# Patient Record
Sex: Female | Born: 1940 | Race: White | Hispanic: No | Smoking: Never smoker
Health system: Southern US, Community
[De-identification: ages and names within clinical notes are randomized; demographics above are authoritative.]

## PROBLEM LIST (undated history)

## (undated) DIAGNOSIS — E569 Vitamin deficiency, unspecified: Secondary | ICD-10-CM

## (undated) DIAGNOSIS — R42 Dizziness and giddiness: Secondary | ICD-10-CM

## (undated) DIAGNOSIS — N390 Urinary tract infection, site not specified: Secondary | ICD-10-CM

## (undated) DIAGNOSIS — G2581 Restless legs syndrome: Secondary | ICD-10-CM

## (undated) HISTORY — DX: Urinary tract infection, site not specified: N39.0

## (undated) HISTORY — DX: Vitamin deficiency, unspecified: E56.9

## (undated) HISTORY — DX: Restless legs syndrome: G25.81

## (undated) HISTORY — DX: Dizziness and giddiness: R42

---

## 2008-02-07 ENCOUNTER — Emergency Department: Payer: Self-pay | Admitting: Emergency Medicine

## 2013-04-26 ENCOUNTER — Emergency Department: Payer: Self-pay | Admitting: Emergency Medicine

## 2013-04-26 LAB — COMPREHENSIVE METABOLIC PANEL
Albumin: 3.9 g/dL (ref 3.4–5.0)
Anion Gap: 5 — ABNORMAL LOW (ref 7–16)
BUN: 9 mg/dL (ref 7–18)
Calcium, Total: 9.1 mg/dL (ref 8.5–10.1)
Co2: 30 mmol/L (ref 21–32)
Creatinine: 0.72 mg/dL (ref 0.60–1.30)
EGFR (Non-African Amer.): >60
Glucose: 86 mg/dL (ref 65–99)
Potassium: 3.5 mmol/L (ref 3.5–5.1)
SGOT(AST): 22 U/L (ref 15–37)
SGPT (ALT): 23 U/L (ref 12–78)
Sodium: 138 mmol/L (ref 136–145)
Total Protein: 8.1 g/dL (ref 6.4–8.2)

## 2013-04-26 LAB — CBC WITH DIFFERENTIAL/PLATELET
Basophil #: 0.1 10*3/uL (ref 0.0–0.1)
Basophil %: 1.1 %
Eosinophil #: 0.4 10*3/uL (ref 0.0–0.7)
HCT: 31.2 % — ABNORMAL LOW (ref 35.0–47.0)
HGB: 10 g/dL — ABNORMAL LOW (ref 12.0–16.0)
Lymphocyte #: 2 10*3/uL (ref 1.0–3.6)
Lymphocyte %: 24.8 %
MCH: 24.7 pg — ABNORMAL LOW (ref 26.0–34.0)
MCHC: 31.9 g/dL — ABNORMAL LOW (ref 32.0–36.0)
Monocyte %: 6.1 %
Neutrophil #: 5 10*3/uL (ref 1.4–6.5)
Platelet: 350 10*3/uL (ref 150–440)
RBC: 4.04 10*6/uL (ref 3.80–5.20)
RDW: 16 % — ABNORMAL HIGH (ref 11.5–14.5)
WBC: 8 10*3/uL (ref 3.6–11.0)

## 2013-04-30 ENCOUNTER — Inpatient Hospital Stay: Payer: Self-pay | Admitting: Specialist

## 2013-04-30 LAB — COMPREHENSIVE METABOLIC PANEL
Albumin: 3.7 g/dL (ref 3.4–5.0)
Alkaline Phosphatase: 131 U/L (ref 50–136)
BUN: 11 mg/dL (ref 7–18)
Bilirubin,Total: 0.7 mg/dL (ref 0.2–1.0)
Calcium, Total: 9.3 mg/dL (ref 8.5–10.1)
Chloride: 103 mmol/L (ref 98–107)
EGFR (Non-African Amer.): >60
Glucose: 102 mg/dL — ABNORMAL HIGH (ref 65–99)
SGOT(AST): 60 U/L — ABNORMAL HIGH (ref 15–37)
Sodium: 138 mmol/L (ref 136–145)
Total Protein: 8 g/dL (ref 6.4–8.2)

## 2013-04-30 LAB — URINALYSIS, COMPLETE
Blood: NEGATIVE
Glucose,UR: NEGATIVE mg/dL (ref 0–75)
Leukocyte Esterase: NEGATIVE
Protein: NEGATIVE
Specific Gravity: 1.015 (ref 1.003–1.030)
Squamous Epithelial: 1
WBC UR: 2 /HPF (ref 0–5)

## 2013-04-30 LAB — CBC
HGB: 9.8 g/dL — ABNORMAL LOW (ref 12.0–16.0)
MCH: 24.4 pg — ABNORMAL LOW (ref 26.0–34.0)
MCHC: 32 g/dL (ref 32.0–36.0)
MCV: 76 fL — ABNORMAL LOW (ref 80–100)
Platelet: 353 10*3/uL (ref 150–440)
RDW: 15.8 % — ABNORMAL HIGH (ref 11.5–14.5)
WBC: 13.5 10*3/uL — ABNORMAL HIGH (ref 3.6–11.0)

## 2013-04-30 LAB — TROPONIN I: Troponin-I: <0.02 ng/mL

## 2013-05-01 ENCOUNTER — Ambulatory Visit: Payer: Self-pay | Admitting: Urology

## 2013-05-01 LAB — BASIC METABOLIC PANEL
Anion Gap: 6 — ABNORMAL LOW (ref 7–16)
Calcium, Total: 8.2 mg/dL — ABNORMAL LOW (ref 8.5–10.1)
Chloride: 100 mmol/L (ref 98–107)
Creatinine: 0.63 mg/dL (ref 0.60–1.30)
EGFR (African American): >60
EGFR (Non-African Amer.): >60
Glucose: 101 mg/dL — ABNORMAL HIGH (ref 65–99)
Osmolality: 270 (ref 275–301)
Potassium: 2.9 mmol/L — ABNORMAL LOW (ref 3.5–5.1)
Sodium: 136 mmol/L (ref 136–145)

## 2013-05-01 LAB — CBC WITH DIFFERENTIAL/PLATELET
Basophil #: 0 10*3/uL (ref 0.0–0.1)
Basophil %: 0.2 %
Eosinophil #: 0 10*3/uL (ref 0.0–0.7)
HCT: 27.4 % — ABNORMAL LOW (ref 35.0–47.0)
HGB: 8.9 g/dL — ABNORMAL LOW (ref 12.0–16.0)
Lymphocyte #: 0.7 10*3/uL — ABNORMAL LOW (ref 1.0–3.6)
Lymphocyte %: 5.4 %
Monocyte %: 6.4 %
Neutrophil #: 11.6 10*3/uL — ABNORMAL HIGH (ref 1.4–6.5)
Neutrophil %: 87.9 %
RBC: 3.61 10*6/uL — ABNORMAL LOW (ref 3.80–5.20)
WBC: 13.2 10*3/uL — ABNORMAL HIGH (ref 3.6–11.0)

## 2013-05-02 LAB — BASIC METABOLIC PANEL
BUN: 8 mg/dL (ref 7–18)
Chloride: 99 mmol/L (ref 98–107)
Co2: 32 mmol/L (ref 21–32)
Creatinine: 0.51 mg/dL — ABNORMAL LOW (ref 0.60–1.30)
Glucose: 105 mg/dL — ABNORMAL HIGH (ref 65–99)
Osmolality: 269 (ref 275–301)
Sodium: 135 mmol/L — ABNORMAL LOW (ref 136–145)

## 2013-05-03 LAB — CBC WITH DIFFERENTIAL/PLATELET
Basophil #: 0.1 10*3/uL (ref 0.0–0.1)
Eosinophil #: 0.1 10*3/uL (ref 0.0–0.7)
Eosinophil %: 1.1 %
HCT: 23.9 % — ABNORMAL LOW (ref 35.0–47.0)
HGB: 7.9 g/dL — ABNORMAL LOW (ref 12.0–16.0)
Lymphocyte %: 15.8 %
MCH: 25.3 pg — ABNORMAL LOW (ref 26.0–34.0)
MCHC: 33.2 g/dL (ref 32.0–36.0)
MCV: 76 fL — ABNORMAL LOW (ref 80–100)
Platelet: 310 10*3/uL (ref 150–440)
RBC: 3.14 10*6/uL — ABNORMAL LOW (ref 3.80–5.20)
RDW: 16.1 % — ABNORMAL HIGH (ref 11.5–14.5)
WBC: 10.1 10*3/uL (ref 3.6–11.0)

## 2013-05-03 LAB — BASIC METABOLIC PANEL
BUN: 8 mg/dL (ref 7–18)
Calcium, Total: 8.1 mg/dL — ABNORMAL LOW (ref 8.5–10.1)
Chloride: 100 mmol/L (ref 98–107)
Creatinine: 0.61 mg/dL (ref 0.60–1.30)
EGFR (Non-African Amer.): >60
Osmolality: 274 (ref 275–301)
Potassium: 3.9 mmol/L (ref 3.5–5.1)
Sodium: 138 mmol/L (ref 136–145)

## 2013-05-04 LAB — HEMOGLOBIN: HGB: 8.2 g/dL — ABNORMAL LOW (ref 12.0–16.0)

## 2013-07-07 ENCOUNTER — Emergency Department: Payer: Self-pay | Admitting: Emergency Medicine

## 2013-07-07 LAB — COMPREHENSIVE METABOLIC PANEL
Albumin: 3.6 g/dL (ref 3.4–5.0)
Anion Gap: 3 — ABNORMAL LOW (ref 7–16)
BUN: 12 mg/dL (ref 7–18)
Co2: 32 mmol/L (ref 21–32)
Creatinine: 0.6 mg/dL (ref 0.60–1.30)
Osmolality: 275 (ref 275–301)
Potassium: 4.2 mmol/L (ref 3.5–5.1)
SGOT(AST): 27 U/L (ref 15–37)
SGPT (ALT): 24 U/L (ref 12–78)

## 2013-07-07 LAB — CBC
HCT: 34 % — ABNORMAL LOW (ref 35.0–47.0)
MCH: 27.6 pg (ref 26.0–34.0)
RBC: 4.04 10*6/uL (ref 3.80–5.20)
RDW: 21 % — ABNORMAL HIGH (ref 11.5–14.5)

## 2013-08-07 ENCOUNTER — Ambulatory Visit: Payer: Self-pay | Admitting: Urology

## 2014-02-18 DIAGNOSIS — S72009A Fracture of unspecified part of neck of unspecified femur, initial encounter for closed fracture: Secondary | ICD-10-CM | POA: Insufficient documentation

## 2014-04-09 ENCOUNTER — Ambulatory Visit (INDEPENDENT_AMBULATORY_CARE_PROVIDER_SITE_OTHER): Payer: Medicare Other | Admitting: Podiatry

## 2014-04-09 ENCOUNTER — Encounter: Payer: Self-pay | Admitting: Podiatry

## 2014-04-09 ENCOUNTER — Other Ambulatory Visit: Payer: Self-pay | Admitting: *Deleted

## 2014-04-09 VITALS — BP 118/75 | HR 89 | Resp 16 | Ht 65.0 in | Wt 112.0 lb

## 2014-04-09 DIAGNOSIS — B351 Tinea unguium: Secondary | ICD-10-CM

## 2014-04-09 DIAGNOSIS — L600 Ingrowing nail: Secondary | ICD-10-CM

## 2014-04-09 NOTE — Progress Notes (Signed)
   Subjective:    Patient ID: Katherine Cisneros, female    DOB: September 15, 1941, 73 y.o.   MRN: [id number]  HPI Comments: The right great toenail is giving me trouble, it is not growing right, thick discolored and growing up .     Review of Systems     Objective:   Physical Exam        Assessment & Plan:

## 2014-04-09 NOTE — Progress Notes (Signed)
Subjective:     Patient ID: Katherine Cisneros, female   DOB: 1941-02-20, 73 y.o.   MRN: [id number]  HPI patient presents stating I have a damaged big toenail on my right foot and I have trouble wearing shoes   Review of Systems  All other systems reviewed and are negative.      Objective:   Physical Exam  Nursing note and vitals reviewed. Constitutional: She is oriented to person, place, and time.  Cardiovascular: Intact distal pulses.   Musculoskeletal: Normal range of motion.  Neurological: She is oriented to person, place, and time.  Skin: Skin is warm.   neurovascular status intact with muscle strength found to be normal and diminished range of motion subtalar midtarsal joint. Patient is found to have digits are well perfused and the right hallux nail is very damaged abnormal with growth and painful when pressed from a dorsal direction     Assessment:     Abnormal damaged hallux nail right    Plan:     H&P performed and conditions discussed. Recommended removal and explained procedure to patient. Infiltrated 60 mg like Marcaine mixture remove the hallux nail and exposed root and applied chemical phenol 5 applications 30 seconds followed by alcohol lavaged and sterile dressing. Gave instructions on soaks and reappoint

## 2014-04-09 NOTE — Patient Instructions (Addendum)

## 2014-04-14 DIAGNOSIS — C419 Malignant neoplasm of bone and articular cartilage, unspecified: Secondary | ICD-10-CM | POA: Insufficient documentation

## 2014-06-19 DIAGNOSIS — C7800 Secondary malignant neoplasm of unspecified lung: Secondary | ICD-10-CM | POA: Insufficient documentation

## 2014-07-02 ENCOUNTER — Encounter: Payer: Self-pay | Admitting: Podiatry

## 2014-07-02 ENCOUNTER — Ambulatory Visit (INDEPENDENT_AMBULATORY_CARE_PROVIDER_SITE_OTHER): Payer: Medicare Other | Admitting: Podiatry

## 2014-07-02 VITALS — BP 141/81 | HR 95 | Resp 18

## 2014-07-02 DIAGNOSIS — L03039 Cellulitis of unspecified toe: Secondary | ICD-10-CM

## 2014-07-02 NOTE — Progress Notes (Signed)
Subjective:     Patient ID: Katherine Cisneros, female   DOB: August 16, 1941, 73 y.o.   MRN: [id number]  HPI patient presents with a crusted right hallux nailbed that she was concerned about because it still hurts her at times   Review of Systems     Objective:   Physical Exam Neurovascular status intact with a crusted nailbed right that is localized as far as redness and irritation    Assessment:     Mild paronychia infection right hallux nail localized in nature    Plan:     Continue soaks and bandage usage at night and it should heal uneventfully if any proximal redness or drainage were to recur she is to come in and let us look at

## 2014-09-28 ENCOUNTER — Ambulatory Visit: Payer: Self-pay | Admitting: Internal Medicine

## 2014-10-08 ENCOUNTER — Emergency Department: Payer: Self-pay | Admitting: Emergency Medicine

## 2014-10-08 LAB — COMPREHENSIVE METABOLIC PANEL
ALBUMIN: 2.6 g/dL — AB (ref 3.4–5.0)
Alkaline Phosphatase: 166 U/L — ABNORMAL HIGH
Anion Gap: 7 (ref 7–16)
BUN: 14 mg/dL (ref 7–18)
Bilirubin,Total: 0.2 mg/dL (ref 0.2–1.0)
Calcium, Total: 8.5 mg/dL (ref 8.5–10.1)
Chloride: 100 mmol/L (ref 98–107)
Co2: 29 mmol/L (ref 21–32)
Creatinine: 0.53 mg/dL — ABNORMAL LOW (ref 0.60–1.30)
EGFR (African American): >60
EGFR (Non-African Amer.): >60
GLUCOSE: 106 mg/dL — AB (ref 65–99)
Osmolality: 273 (ref 275–301)
Potassium: 3.4 mmol/L — ABNORMAL LOW (ref 3.5–5.1)
SGOT(AST): 23 U/L (ref 15–37)
SGPT (ALT): 60 U/L
Sodium: 136 mmol/L (ref 136–145)
Total Protein: 7.6 g/dL (ref 6.4–8.2)

## 2014-10-08 LAB — PRO B NATRIURETIC PEPTIDE: B-Type Natriuretic Peptide: 152 pg/mL — ABNORMAL HIGH (ref 0–125)

## 2014-10-08 LAB — PROTIME-INR
INR: 1.1
Prothrombin Time: 32.6 secs — ABNORMAL HIGH (ref 11.5–14.7)

## 2014-10-08 LAB — CBC
HCT: 34.2 % — AB (ref 35.0–47.0)
HGB: 10.9 g/dL — ABNORMAL LOW (ref 12.0–16.0)
MCH: 29.1 pg (ref 26.0–34.0)
MCHC: 31.9 g/dL — ABNORMAL LOW (ref 32.0–36.0)
MCV: 91 fL (ref 80–100)
Platelet: 517 10*3/uL — ABNORMAL HIGH (ref 150–440)
RBC: 3.75 10*6/uL — AB (ref 3.80–5.20)
RDW: 12.7 % (ref 11.5–14.5)
WBC: 21.9 10*3/uL — ABNORMAL HIGH (ref 3.6–11.0)

## 2014-10-08 LAB — CK TOTAL AND CKMB (NOT AT ARMC)
CK, Total: 20 U/L — ABNORMAL LOW (ref 26–192)
CK-MB: <0.5 ng/mL — ABNORMAL LOW (ref 0.5–3.6)

## 2014-10-08 LAB — APTT: ACTIVATED PTT: 32.6 s (ref 23.6–35.9)

## 2014-10-08 LAB — TROPONIN I: Troponin-I: <0.02 ng/mL

## 2014-10-13 LAB — CULTURE, BLOOD (SINGLE)

## 2014-10-29 ENCOUNTER — Ambulatory Visit: Payer: Self-pay | Admitting: Internal Medicine

## 2014-11-29 ENCOUNTER — Ambulatory Visit: Payer: Self-pay | Admitting: Internal Medicine

## 2014-12-02 ENCOUNTER — Emergency Department: Payer: Self-pay | Admitting: Emergency Medicine

## 2014-12-02 LAB — CBC
HCT: 37.3 % (ref 35.0–47.0)
HGB: 11.9 g/dL — AB (ref 12.0–16.0)
MCH: 27.5 pg (ref 26.0–34.0)
MCHC: 31.8 g/dL — AB (ref 32.0–36.0)
MCV: 87 fL (ref 80–100)
Platelet: 579 10*3/uL — ABNORMAL HIGH (ref 150–440)
RBC: 4.32 10*6/uL (ref 3.80–5.20)
RDW: 17.6 % — ABNORMAL HIGH (ref 11.5–14.5)
WBC: 36 10*3/uL — ABNORMAL HIGH (ref 3.6–11.0)

## 2014-12-02 LAB — COMPREHENSIVE METABOLIC PANEL
ALK PHOS: 111 U/L (ref 46–116)
Albumin: 2.4 g/dL — ABNORMAL LOW (ref 3.4–5.0)
Anion Gap: 5 — ABNORMAL LOW (ref 7–16)
BILIRUBIN TOTAL: 0.9 mg/dL (ref 0.2–1.0)
BUN: 11 mg/dL (ref 7–18)
CO2: 36 mmol/L — AB (ref 21–32)
CREATININE: 0.67 mg/dL (ref 0.60–1.30)
Calcium, Total: 8.3 mg/dL — ABNORMAL LOW (ref 8.5–10.1)
Chloride: 95 mmol/L — ABNORMAL LOW (ref 98–107)
EGFR (African American): >60
EGFR (Non-African Amer.): >60
GLUCOSE: 148 mg/dL — AB (ref 65–99)
OSMOLALITY: 274 (ref 275–301)
POTASSIUM: 2.9 mmol/L — AB (ref 3.5–5.1)
SGOT(AST): 22 U/L (ref 15–37)
SGPT (ALT): 17 U/L (ref 14–63)
SODIUM: 136 mmol/L (ref 136–145)
Total Protein: 7.1 g/dL (ref 6.4–8.2)

## 2014-12-02 LAB — CK TOTAL AND CKMB (NOT AT ARMC)
CK, Total: 21 U/L — ABNORMAL LOW (ref 26–192)
CK-MB: 2.2 ng/mL (ref 0.5–3.6)

## 2014-12-02 LAB — TROPONIN I: TROPONIN-I: 0.57 ng/mL — AB

## 2014-12-02 LAB — CK-MB: CK-MB: 2.7 ng/mL (ref 0.5–3.6)

## 2014-12-02 LAB — PRO B NATRIURETIC PEPTIDE: B-Type Natriuretic Peptide: 1437 pg/mL — ABNORMAL HIGH (ref 0–125)

## 2014-12-28 ENCOUNTER — Ambulatory Visit
Admit: 2014-12-28 | Disposition: A | Discharge status: Home / Self Care | Payer: Self-pay | Attending: Internal Medicine | Admitting: Internal Medicine

## 2014-12-28 DEATH — deceased

## 2015-02-04 IMAGING — CR DG CHEST 1V PORT
1 series · 1 of 1 positions shown · non-contrast
Comparison: Chest x-ray of 10/08/2014 and 04/30/2013

CLINICAL DATA: Shortness of breath, history of pneumonia, history
of lung carcinoma

EXAM:
PORTABLE CHEST - 1 VIEW

[ap]
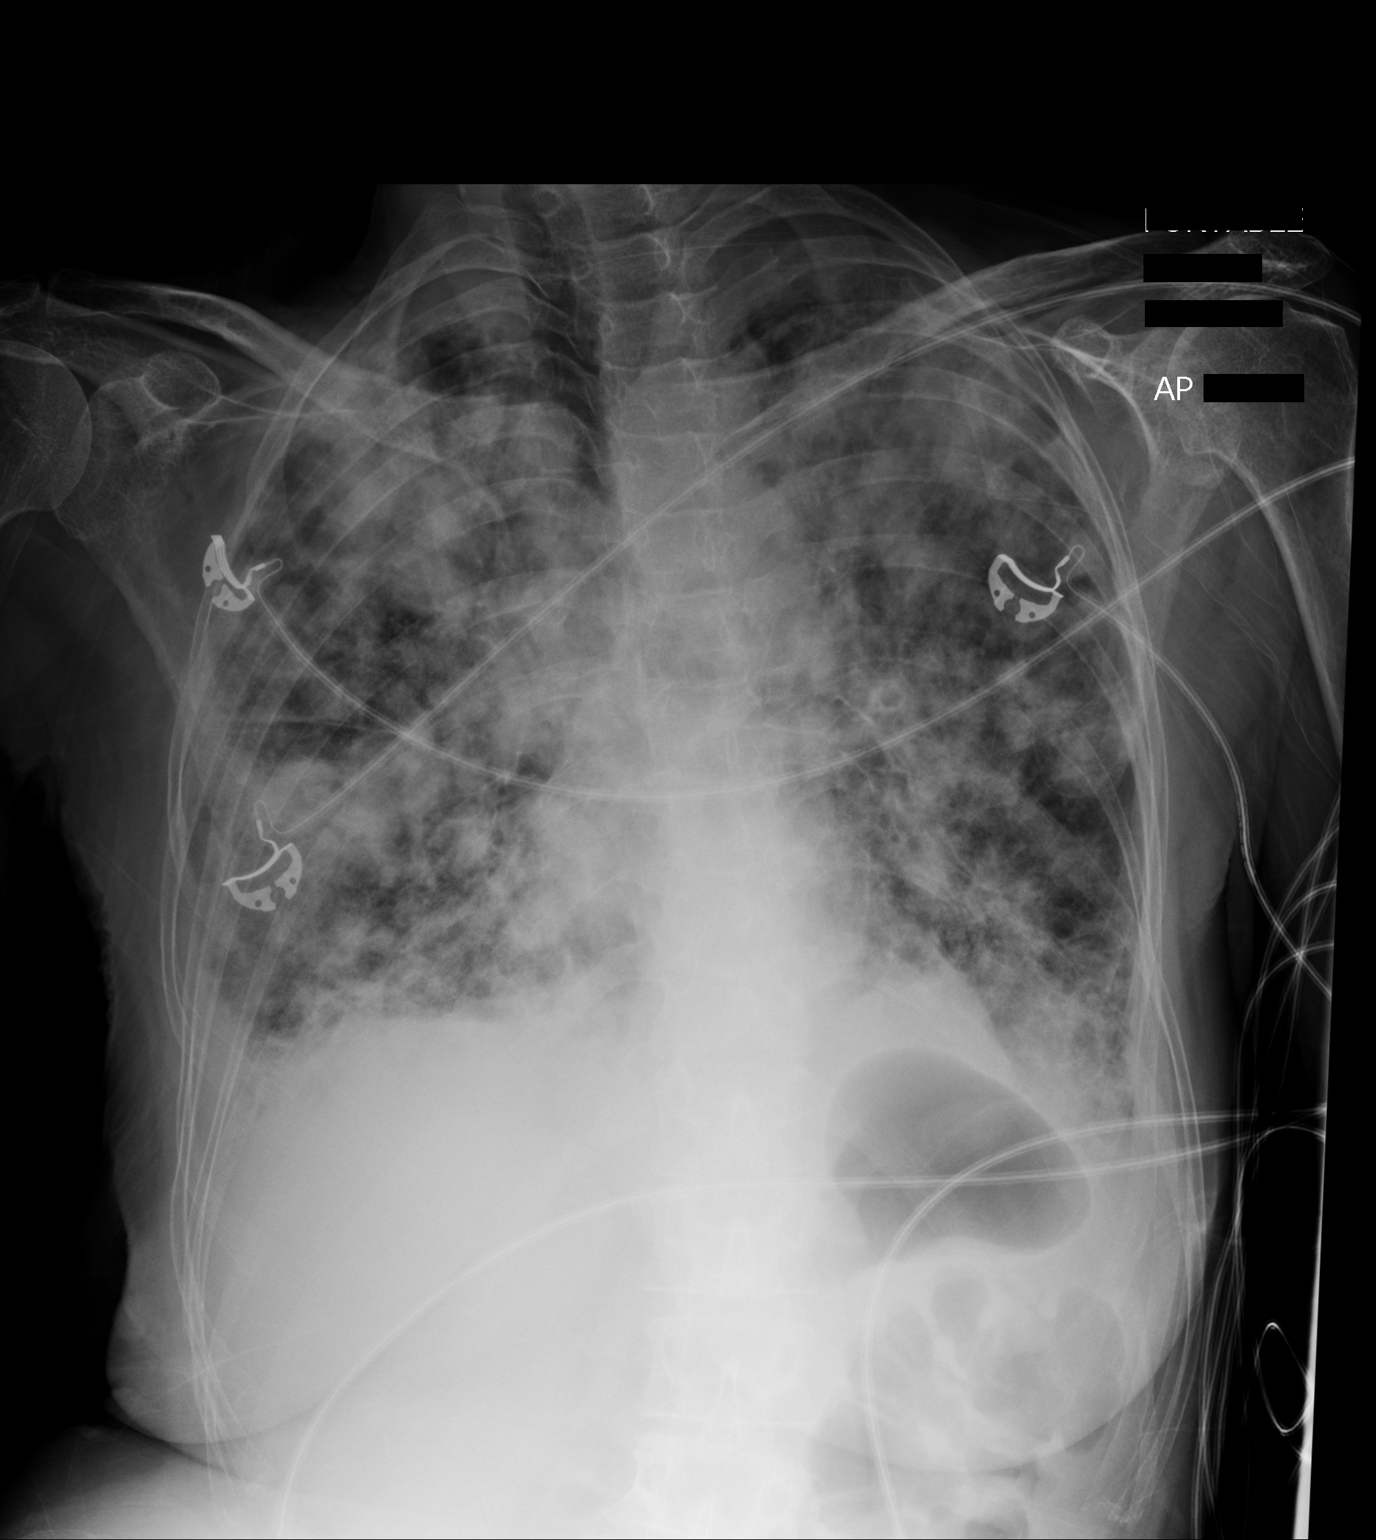

[1 of 1 positions shown; findings below may reference images not displayed]

FINDINGS: There has been significant worsening of the nodular opacities
throughout the lungs which have increased in size and number most
consistent with diffuse metastatic involvement of the lungs. There
may be small pleural effusions present. Mild cardiomegaly is stable.
No acute bony abnormality is noted.
IMPRESSION: Significant worsening of multiple nodular lesions throughout the
lungs which have increased in size and number consistent with
diffuse metastatic involvement of the lungs. Probable small
effusions.

## 2015-02-18 NOTE — H&P (Signed)
Subjective/Chief Complaint Pain right leg   History of Present Illness 74 year old female fell recently and complained of right leg pain. Also had reddness and swelling of lower right leg. Seen in Emergency Room 04/26/13 and treated for cellulitis.  Returned today due to inability to walk. ct scan of abdomen and pelvis disclosed displaced intertrochanteric fracture right hip.  Admit for medical evaluation and surgery. Discussed with patient and ex-husband.  Risks and benefits of surgery were discussed at length including but not limited to infection, non union, nerve or blood vessed damage, non union, need for repeat surgery, blood clots and lung emboli, and death. Plan surgery today if cleared.   Past Med/Surgical Hx:  chronic hip pain:   ALLERGIES:  Sulfa drugs: Unknown  HOME MEDICATIONS: Medication Instructions Status  cephalexin 500 mg oral capsule 1 cap(s) orally 3 times a day for 10 days Active  traMADol 50 mg oral tablet 0.5-1 tab(s) orally 3 times a day, As Needed for breakthrough pain Active  ibuprofen 200 mg oral tablet 1-2 tab(s) orally every 4 hours, As Needed - for Pain Active  acetaminophen 500 mg oral tablet 2 tabs orally 3 times a day Active  gabapentin 300 mg oral capsule 1 cap(s) orally once a day (at bedtime) Active   Family and Social History:  Family History Non-Contributory   Social History negative tobacco, negative ETOH   Place of Living Home   Review of Systems:  Fever/Chills No   Cough No   Sputum No   Abdominal Pain No   Physical Exam:  GEN well developed, well nourished, no acute distress   HEENT pink conjunctivae   NECK supple   RESP normal resp effort   CARD regular rate   ABD denies tenderness   GU foley catheter in place   LYMPH negative neck   EXTR Pain with rotation of right hip.  circulation/sensation/motor function good.  right leg slightly short and rotated.  some reddness of skin above ankle and some swelling.   NEURO  motor/sensory function intact   PSYCH alert, A+O to time, place, person, poor insight   Lab Results: Hepatic:  03-Jul-14 11:57   Bilirubin, Total 0.7  Alkaline Phosphatase 131  SGPT (ALT) 29  SGOT (AST)  60  Total Protein, Serum 8.0  Albumin, Serum 3.7  Routine Chem:  03-Jul-14 11:57   Glucose, Serum  102  BUN 11  Creatinine (comp) 0.75  Sodium, Serum 138  Potassium, Serum  3.1  Chloride, Serum 103  CO2, Serum 28  Calcium (Total), Serum 9.3  Osmolality (calc) 275  eGFR (African American) >60  eGFR (Non-African American) >60 (eGFR values <38mL/min/1.73 m2 may be an indication of chronic kidney disease (CKD). Calculated eGFR is useful in patients with stable renal function. The eGFR calculation will not be reliable in acutely ill patients when serum creatinine is changing rapidly. It is not useful in  patients on dialysis. The eGFR calculation may not be applicable to patients at the low and high extremes of body sizes, pregnant women, and vegetarians.)  Anion Gap 7  Cardiac:  03-Jul-14 11:57   Troponin I < 0.02 (0.00-0.05 0.05 ng/mL or less: NEGATIVE  Repeat testing in 3-6 hrs  if clinically indicated. >0.05 ng/mL: POTENTIAL  MYOCARDIAL INJURY. Repeat  testing in 3-6 hrs if  clinically indicated. NOTE: An increase or decrease  of 30% or more on serial  testing suggests a  clinically important change)  Routine UA:  03-Jul-14 14:52   Color (  UA) Yellow  Clarity (UA) Clear  Glucose (UA) Negative  Bilirubin (UA) Negative  Ketones (UA) 1+  Specific Gravity (UA) 1.015  Blood (UA) Negative  pH (UA) 5.0  Protein (UA) Negative  Nitrite (UA) Negative  Leukocyte Esterase (UA) Negative (Result(s) reported on 30 Apr 2013 at 03:16PM.)  RBC (UA) <1 /HPF  WBC (UA) 2 /HPF  Bacteria (UA) NONE SEEN  Epithelial Cells (UA) 1 /HPF  Mucous (UA) PRESENT (Result(s) reported on 30 Apr 2013 at 03:16PM.)  Routine Hem:  03-Jul-14 11:57   WBC (CBC)  13.5  RBC (CBC) 3.99   Hemoglobin (CBC)  9.8  Hematocrit (CBC)  30.5  Platelet Count (CBC) 353 (Result(s) reported on 30 Apr 2013 at 12:41PM.)  MCV  76  MCH  24.4  MCHC 32.0  RDW  15.8   Radiology Results: LabUnknown:    03-Jul-14 15:42, CT Abdomen and Pelvis Without Contrast  PACS Image  CT:  CT Abdomen and Pelvis Without Contrast  REASON FOR EXAM:    (1) diffuse abd pain, worse RLQ intermittent x 3 wks;   (2) see above  COMMENTS:       PROCEDURE: CT  - CT ABDOMEN AND PELVIS W0  - Apr 30 2013  3:42PM     RESULT: Cecal and diffuse abdominal pain.    Comparison Study: No prior.    Findings: Standard nonenhanced CT obtained. Evaluation in 3 dimensions on   separate workstation performed. Liver normal. Gallbladder nondistended.   No pericholecystic fluid collections. No biliary distention. Pancreas   normal. Spleen normal. Adrenals normal. A 1.9 cm hyperdense lesion is   present in the right kidney. Calcification  present in the periphery of     this lesion. This could represent renal cell carcinoma. Urologic   consultation suggested. Left kidney is unremarkable. Mild bladder   distention. Phleboliths in pelvis. No uterine or adnexal abnormalities   identified. Aorta normal caliber. No adenopathy. No bowel distention. No   free air. Atelectasis and/or scarring lung bases noted. Bronchiectasis   present in lung bases. Nofree air. Appendix normal. There is a right   femoral neck fracture. This appears acute. Report phoned to patient's   physician at time of study.    IMPRESSION:    1. Acute right femoral neck fracture. The fracture is angulated.   Orthopedic consultation suggested.  2. 1.9 cm right renal lesion. Renal cell carcinoma cannot be excluded.   Urologic consultation suggested. The bladder is distended.  3. Changes of scarring and bronchiectasis with atelectasis in the lung     bases.        Verified ZG:YFVCBS E. REGISTER, M.D., MD    Assessment/Admission Diagnosis Displaced  intertrochanteric fracture right hip   Plan Right hip pinning.   Electronic Signatures: Park Breed (MD)  (Signed 03-Jul-14 17:09)  Authored: CHIEF COMPLAINT and HISTORY, PAST MEDICAL/SURGIAL HISTORY, ALLERGIES, HOME MEDICATIONS, FAMILY AND SOCIAL HISTORY, REVIEW OF SYSTEMS, PHYSICAL EXAM, LABS, Radiology, ASSESSMENT AND PLAN   Last Updated: 03-Jul-14 17:09 by Park Breed (MD)

## 2015-02-18 NOTE — Consult Note (Signed)
Chief Complaint:  Subjective/Chief Complaint Urology F/U (Right Renal Mass)  Pt without new complaints.   VITAL SIGNS/ANCILLARY NOTES: **Vital Signs.:   05-Jul-14 08:40  Vital Signs Type Routine  Temperature Temperature (F) 99.6  Celsius 37.5  Temperature Source oral  Pulse Pulse 105  Respirations Respirations 18  Systolic BP Systolic BP 99  Diastolic BP (mmHg) Diastolic BP (mmHg) 60  Mean BP 73  Pulse Ox % Pulse Ox % 91  Pulse Ox Activity Level  At rest  Oxygen Delivery Room Air/ 21 %  *Intake and Output.:   Daily 05-Jul-14 07:00  Grand Totals Intake:  2126 Output:  1725    Net:  401 24 Hr.:  401  Oral Intake      In:  250  IV (Primary)      In:  1876  Urine ml     Out:  1400  Urine Post Catheter Insertion      Out:  325  Length of Stay Totals Intake:  2695 Output:  2225    Net:  470   Brief Assessment:  GEN well developed, well nourished, no acute distress   Respiratory normal resp effort   Lab Results: Routine Chem:  05-Jul-14 06:06   Glucose, Serum  105  BUN 8  Creatinine (comp)  0.51  Sodium, Serum  135  Potassium, Serum 3.7  Chloride, Serum 99  CO2, Serum 32  Calcium (Total), Serum 8.6  Anion Gap  4  Osmolality (calc) 269  eGFR (African American) >60  eGFR (Non-African American) >60 (eGFR values <17mL/min/1.73 m2 may be an indication of chronic kidney disease (CKD). Calculated eGFR is useful in patients with stable renal function. The eGFR calculation will not be reliable in acutely ill patients when serum creatinine is changing rapidly. It is not useful in  patients on dialysis. The eGFR calculation may not be applicable to patients at the low and high extremes of body sizes, pregnant women, and vegetarians.)  Routine Hem:  05-Jul-14 06:06   Hemoglobin (CBC)  8.2 (Result(s) reported on 02 May 2013 at [facility].)   Radiology Results: CT:    04-Jul-14 16:02, CT Abdomen With Contrast  CT Abdomen With Contrast   REASON FOR EXAM:    r/o  enhancing right renal mass - please compare with   CT without Contrast from 7/  COMMENTS:       PROCEDURE: CT  - CT ABDOMEN STANDARD W  - May 01 2013  4:02PM     RESULT: History: Right renal mass    Comparison: None    Technique: Multiple axial images of the abdomen were performed from the   lung bases to the iliac crests, without p.o. contrast and with 100 ml of   Isovue 370 intravenous contrast.    Findings:    There is bibasilar airspace disease, left greater than right. There is a   trace left pleural effusion. There is bibasilar bronchiectasis.    The liver demonstrates no focal abnormality. There is no intrahepatic or   extrahepatic biliary ductal dilatation. The gallbladder is unremarkable.   The spleen demonstrates no focal abnormality. The adrenal glands, and   pancreas are normal.     There is a 19 mm hyperdense right renal mass measuring 68 Hounsfield   units on precontrast examination dated 04/30/2013, 68 Hounsfield units on   the postcontrast nephrographic phase and 69 Hounsfield units on delayed   excretory phase. There are small areas of neural calcification. There is   no  other renal mass.  The visualized portions of the stomach, duodenum, small intestine, and   large intestine demonstrate no contrast extravasation or dilatation.   There is no pneumoperitoneum, pneumatosis, or portal venous gas. There is   no abdominal free fluid. There is no lymphadenopathy.     The abdominal aorta is normal in caliber with atherosclerosis.    The osseous structures are unremarkable.    IMPRESSION:     1. Nonenhancing hyperdense 19 mm right renal mass which may reflect a   proteinaceous cyst or hemorrhagic cyst. Recommend followup CT of the   abdomen in 6 months for stability.  2. There is bibasilar airspace disease which may represent atelectasis   versus pneumonia.    Dictation Site: 1        Verified By: Jennette Banker, M.D., MD   Assessment/Plan:   Assessment/Plan:  Assessment Right Renal Mass - no enhancement demonstrated in  the contrasted CT yesterday - consistent with a hyperdense cyst; no current indication for urologic intervention, however, may still potentially be a renal neoplasm, and so needs to be followed.  Discussed, at length, with the patient, stressing the need for f/u with Dr. Maryan Puls for a repeat Renal Protocol CT in 6 months.   Plan Serial imaging with a renal protocol CT in 6 months.  If the patient is to be discharged prior to Monday, she should be given a referral for Dr. Maryan Puls to follow up the right hyperdense renal mass.  Will sign off - please re-consult if needed.   Electronic Signatures: Darcella Cheshire (MD)  (Signed 05-Jul-14 09:48)  Authored: Chief Complaint, VITAL SIGNS/ANCILLARY NOTES, Brief Assessment, Lab Results, Radiology Results, Assessment/Plan   Last Updated: 05-Jul-14 09:48 by Darcella Cheshire (MD)

## 2015-02-18 NOTE — Consult Note (Signed)
Brief Consult Note: Diagnosis: preop medical clearance.   Patient was seen by consultant.   Consult note dictated.   Recommend to proceed with surgery or procedure.   Orders entered.   Discussed with Attending MD.   Comments: 1. preop medical clearance: medically clear for planned surgery, not having any active cardio-respi. issues. will get EKG to make sure any acute changes. she is at mild risk for planned surgery  2. hypokalemia: repelte and recheck, check mg  3. Acute right femoral neck fracture(angulated): ortho planning to fix this tonight, discussed with dr Sabra Heck.  4. renal mass: 1.9 CM, will c/s urology  full code.  Electronic Signatures: Remer Macho (MD)  (Signed 03-Jul-14 17:20)  Authored: Brief Consult Note   Last Updated: 03-Jul-14 17:20 by Remer Macho (MD)

## 2015-02-18 NOTE — Consult Note (Signed)
Urology Consultation Report  Reason For Consultation: Right Renal Mass on CT  Requesting MD: Vipul S. Manuella Ghazi, M.D. Consulting Urologist: Darcella Cheshire, M.D.  HPI: 74 y.o. MWF admitted 04/30/2013 with acute, displaced right intertrochanteric hip fracture s/p fall. In the ER, a CT of the Abd/Pelvis without contrast revealed a 1.9cm hyperdense Right Renal Mass. The pt underwent ORIF of the right hip fracture the evening of admission.  Urologic Hx: The patient denies any h/o hematuria, weight loss, cough/hemoptysis, or new bony pain (aside from the hip fracture). The pt denies any FH of RCC.  PMH/PSH/SH/FH/ROS as per Dr. Trena Platt detailed Consultation Report dated 04/30/2013 as confirmed with the patient, with the following additions:  1. The pt's reaction to sulfa drugs is a rash. 2. FH positive for HTN (Mother and Son)  Exam: T (99.16F), P (90), RR (18), BP (101/60), O2 Sat (94%) Gen: WDWN WF in NAD Skin: Warm/Dry; No lesions about the Head/Neck Lymphatics: no submandibular, cervical, supraclavicular, or axillary adenopathy HEENT: [place]/AT, EOMI, Anicteric, Edentulous Neck: no masses/bruits Chest: CTA, nL Respiratory Effort Cor: RRR without M/G/R, 2+ Radial and Carotid pulses b/L Abd: soft/flat, NT/ND, NABS, no palpable masses/organomegaly; no CVAT Ext: 1+ pitting edema LE, b/L, to the knees Neuro: non-focal Psych: A&O x4, Pleasant/Cooperative  Labs/Imaging: Cr (0.63), K+ (2.9), Ca++ (8.2), WBC (13.2k), Hct (27.4%), Plt (321k) -UA from 04/30/2013: <1 rbc, 2 wbc -CT Abd/Pelvis without Contrast 04/30/2013 (images personally reviewed): 1.9cm hyperdense lesion posterolat upper/mid pole right kidney with scattered calcifications  Assessment:  1.9cm Hyperdense posterolateral aspect of the Right Upper/Mid Kidney - incompletely characterized without post-IV contrast images -d/w pt, the possibility of a renal neoplasm with the recommendation for a 3 PHASE RENAL CT to better characterize the area of  interest  Recommend:  Renal Protocol CT (without and with IV Contrast with immediate and delayed post-contrast images to assess for enhancement)  Thank you for the opportunity to participate in the care of this most pleasant woman. We will f/u the CT with a revised Assessment and Recommendations.  Electronic Signatures: Darcella Cheshire (MD)  (Signed on 04-Jul-14 14:52)  Authored  Last Updated: 04-Jul-14 14:52 by Darcella Cheshire (MD)

## 2015-02-18 NOTE — Op Note (Signed)
PATIENT NAME:  Katherine Cisneros, ENGE MR#:  [id number] DATE OF BIRTH:  [birth year]  DATE OF PROCEDURE:  04/30/2013  PREOPERATIVE DIAGNOSIS:  Displaced base neck fracture, right hip.   POSTOPERATIVE DIAGNOSIS:  Displaced base neck fracture, right hip.  PROCEDURE PERFORMED:  Open reduction internal fixation right hip fracture with a Synthes dynamic hip screw compression screw device (135 degree four hole plate, 85 mm lag screw, 4 cortical screws and 1 compression screw).   SURGEON:  Park Breed, M.D.   ANESTHESIA:  General endotracheal.   COMPLICATIONS:  None.   DRAINS:  Two Hemovacs.   ESTIMATED BLOOD LOSS:  250 mL.   REPLACED:  None.   DESCRIPTION OF PROCEDURE:  The patient was brought to the Operating Room where she underwent satisfactory general endotracheal anesthesia in the supine position.  She was placed on the fracture table and the left leg was flexed and abducted and the right leg was placed in traction and internally rotated.  Fluoroscopy showed excellent position and reduction of the fracture.  This fracture was right at the base of the neck.  I did not feel that percutaneous pinning with multiple screws was indicated given the proximity of the fracture.  After sterile prep and drape the lateral incision was made and dissection carried out sharply through subcutaneous tissue and fascia.  The muscle was elevated anteriorly off the lateral femur and a 1/4 inch drill hole made in the proximal femur.  A guidepin was inserted at 135 degree radius and is centered on both AP and lateral film views.  A second guidepin was inserted superior to this to prevent any rotation of the head and neck during the procedure.  The traction was then released to allow for compression of the fracture.  A step cup reamer was introduced and an 85 mm lag screw was put in place.  Four hole 135 degree plate was placed over this and fixed to the shaft with four cortical screws.  Compression screw was inserted and  tightened to prevent rotation of the fracture of the head and neck.  An auxiliary guide pin was removed.  Fluoroscopy showed good position of the fracture and the hardware.  The wound was irrigated and the fascia closed with #2 Quill over a Hemovac drain.  The subcutaneous tissue was closed with 0 Quill over another Hemovac and the skin was closed with staples.  A dry sterile dressing was applied and the Hemovac was activated.  The patient was transferred to her hospital bed and taken to recovery in good condition after having been awakened by anesthesia.  The hip had good range of motion without crepitus and good rotation.     ____________________________ Park Breed, MD hem:ea D: 04/30/2013 22:35:38 ET T: 05/01/2013 04:43:33 ET JOB#: [id number]  cc: Park Breed, MD, <Dictator> Park Breed MD ELECTRONICALLY SIGNED 05/02/2013 11:35

## 2015-02-18 NOTE — Consult Note (Signed)
PATIENT NAME:  Katherine Cisneros, PARDY MR#:  [id number] DATE OF BIRTH:  October 19, 1941  DATE OF CONSULTATION:  04/30/2013  REFERRING PHYSICIAN:  Earnestine Leys, MD  CONSULTING PHYSICIAN:  Janneth Krasner S. Manuella Ghazi, MD PRIMARY CARE PHYSICIAN: None    REASON FOR CONSULTATION: Preop medical clearance.   HISTORY OF PRESENT ILLNESS: The patient is a 74 year old female with a known history of arthritis, is being admitted for acute right displaced hip fracture, and we are consulted for preop medical clearance. The patient had a recent fall last night and started feeling severe pain in her right leg along with some redness and swelling in the leg, was here in the Emergency Department on the 29th and was sent back home and again back as she is unable to walk, with another fall last night, with a lot of vague complaints.  ER had a CT scan of the abdomen and pelvis ordered which showed displaced intertrochanteric fracture of the right hip for which she is being admitted. The patient denies any fever, cough, chest pain, shortness of breath or any other symptoms at this time other than pain in her leg.   PAST MEDICAL HISTORY:  1.  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  2.  DJD.  SOCIAL HISTORY: Non smoking. No alcohol. She lives in a motel.   FAMILY HISTORY: Mother and one brother had heart disease.   ALLERGIES: SULFA DRUGS.   MEDICATIONS AT HOME: 1.  Tramadol 50 mg 1/2  to 1 tablet p.o. 3 times a day as needed.  2.  Ibuprofen 200 mg 1 to 2 tablets every 4 hours as needed.  3.  Gabapentin 300 mg p.o. at bedtime.  4.  Tylenol 500 mg 2 tablets p.o. 3 times a day.   REVIEW OF SYSTEMS: CONSTITUTIONAL: No fever, fatigue, weakness. Positive for pain.   EYES: No blurred or double vision.  ENT: No tinnitus or ear pain. She does have a history of benign parotid gland tumor which has resulted in dry mouth and dry.  RESPIRATORY: No cough, wheezing, hemoptysis.  CARDIOVASCULAR: No chest pain,  orthopnea or edema.  GASTROINTESTINAL: No nausea, vomiting, diarrhea, minimal abdominal pain.  GENITOURINARY: No dysuria or hematuria.  ENDOCRINE: No polyuria or nocturia.  HEMATOLOGY: No anemia or easy bruising.  SKIN: Minimal redness on her right leg. No rash or ulcer.  MUSCULOSKELETAL: Arthritis present.  Had 2 falls within the last 2 days and now has a right hip fracture.  NEUROLOGIC: No tingling, numbness, weakness. She does have difficult ambulation due to recent fall.  PSYCHIATRIC:  No history of anxiety or depression.   PHYSICAL EXAMINATION: VITAL SIGNS: Temperature 98.3, heart rate 108 per minute, respirations 20 per minute, blood pressure 171/76 mmHg.  She is saturating 96% on room air.  GENERAL: The patient is a 74 year old female lying in the bed in no acute respiratory distress.  HEENT:  Eyes: Pupils are equal, round, reactive to light and accommodation. No scleral icterus. Extraocular muscles intact.  Head: Atraumatic, normocephalic. Oropharynx and nasopharynx: Clear.  NECK: Supple. No jugular venous distention. No thyroid enlargement or tenderness.  LUNGS: Clear to auscultation bilaterally. No wheezing, rales, rhonchi or crepitation.  ABDOMEN: Soft, nontender, nondistended. Bowel sounds present. No organomegaly or masses.  EXTREMITIES: She has trace peripheral edema in the right lower extremity.  Left has no edema. No cyanosis or clubbing. The patient also has pain and tenderness with rotation of the right hip.  Circulation seems good, but her right  leg is slightly shortened and rotated internally. She also has minimal redness of the skin above her ankle with minimal swelling.  NEUROLOGICAL: Cranial nerves II through XII seem intact. Sensation is intact. Muscle strength 5 out of 5 in all extremities. Right leg was not examined as she having a lot of pain, but she is moving her legs voluntarily.   PSYCHIATRIC:  The patient is alert and oriented x 3. She does seem to have some poor  insight, might be some underlying psychiatric issue with some mild mental retardation possible.      LABORATORY AND RADIOLOGICAL DATA:  Normal  BMP except potassium of 3.1. Normal liver function tests except AST of 60. Negative first set of troponin. CBC showed white count of 13.5, hemoglobin 9.8, hematocrit 30.5, platelets 353.  Chest x-ray in the ED showed no acute cardiopulmonary disease.  Lower extremity Doppler on both legs showed no DVT.  CT scan of the head without contrast showed no acute abnormality.  CT scan of the abdomen and pelvis without contrast in the ED showed acute right femoral neck fracture which is angulated.  Right renal lesion, 1.9 cm, cannot exclude renal cell carcinoma.  Bladder is distended. Changes of scarring and bronchiectasis with atelectasis in the lung bases.   IMPRESSION AND PLAN: 1.  Preop medical clearance:  The patient is medically cleared for planned surgery. She will be at mild risk with underlying pulmonary fibrosis. She does not have any acute. We will get EKG for completion but doubt there should be any changes.  I will evaluate for any acute changes at this point. This was discussed with orthopedics, Dr. Sabra Heck.  We will start her on metoprolol.  2.  Hypertension and tachycardia, likely due to ongoing pain: We will start her on metoprolol for better rate control and preop beta-blockade. 3.  Hypokalemia:  We will replete and recheck. We will check magnesium.  4.  Acute right femoral neck fracture, which is angulated:  Orthopedics, Dr. Sabra Heck, is planning to fix this tonight. Case was discussed with Dr. Sabra Heck.  5.  Renal mass about 1.9 cm:  We will consult Urology as per recommendation as this could be underlying renal cell carcinoma.   CODE STATUS:  FULL CODE.    TOTAL TIME TAKING CARE OF THIS PATIENT: 55 minutes.  ____________________________ Lucina Mellow. Manuella Ghazi, MD vss:cb D: 04/30/2013 17:33:11 ET T: 04/30/2013 20:05:32 ET JOB#: [id number]  cc: Undra Harriman S.  Manuella Ghazi, MD, <Dictator> Park Breed, MD Lucina Mellow [facility] MD ELECTRONICALLY SIGNED 05/05/2013 10:43

## 2015-02-18 NOTE — Discharge Summary (Signed)
PATIENT NAME:  Katherine Cisneros, Katherine Cisneros MR#:  [id number] DATE OF BIRTH:  [birth year]  DATE OF ADMISSION:  04/30/2013 DATE OF DISCHARGE:   05/04/2013  FINAL DIAGNOSES: 1.  Displaced base of neck fracture, right hip.  2.  Parotid gland benign tumor in the past.  3.  Arthritis.  4.  Renal mass on abdominal CT scan.   OPERATION: On 04/30/2013, open reduction and internal fixation of right hip with a Synthes DHS compression hip nail.   COMPLICATIONS: None.   CONSULTATIONS:  Prime Doc and Dr. Delma Officer of urology.     DISCHARGE MEDICATIONS:  Norco for pain p.r.n., Fosamax 70 mg weekly, Neurontin 300 mg at bedtime, tramadol for pain, potassium 20 mEq a day, metoprolol 12.5 mg q.12h, enteric-coated aspirin 1 p.o. b.i.d., iron 1 p.o. daily, milk of magnesia, Zofran as needed.   HISTORY OF PRESENT ILLNESS: The patient is a 74 year old female who has had several recent falls. She was seen in the Emergency Room a few days prior to admission with vague pain. She had some redness in lower leg and thought to have cellulitis. She represented to the ER on 04/30/2013 complaining of lower leg pain. She also had some abdominal symptoms. A CT of the abdomen and pelvis revealed a displaced base of neck fracture of the right hip. Right hip x-rays were taken to confirm this. The patient was admitted for medical evaluation and surgery.   PAST MEDICAL HISTORY: Illnesses as above.   ALLERGIES: SULFA.   MEDICATIONS: As above.   FAMILY HISTORY: Unremarkable.   SOCIAL HISTORY: The patient does not smoke or drink. She lives at home alone.   REVIEW OF SYSTEMS: Unremarkable.     PHYSICAL EXAMINATION: On admission, vital signs were normal. The patient was somewhat confused and unable to locate the source of her pain well. The right leg was slightly shortened and rotated. She had pain with movement of the hip. She had a little redness of the lower leg and mild swelling, consistent with minimal cellulitis.   LABORATORY DATA: On  admission was satisfactory.   HOSPITAL COURSE: The patient was cleared for surgery by the medical service. She was taken to the OR later that night and underwent right hip compression hip pinning. Postoperatively, she did well. Hemoglobin dropped slightly, but remained satisfactory. She was gradually mobilized.  Because she lives alone, she would require a skilled nursing rehab. She is stable and ready for skilled nursing rehab on 05/04/2013. She will be partial weight-bearing on the right. She will return to my office in 2 weeks for exam and x-rays and staple removal. The patient also was seen by the urology service in consultation. She had a CT scan with contrast, which showed a hyperdense cyst. No high indication of a neoplasm.  She was to follow up with Dr. Maryan Puls for repeat renal protocol CT in 6 months.    ____________________________ Park Breed, MD hem:dmm D: 05/04/2013 13:19:53 ET T: 05/04/2013 13:28:29 ET JOB#: [id number]  cc: Park Breed, MD, <Dictator> Otelia Limes. Yves Dill, MD Park Breed MD ELECTRONICALLY SIGNED 05/05/2013 15:00

## 2015-05-24 ENCOUNTER — Telehealth (HOSPITAL_COMMUNITY): Payer: Self-pay

## 2015-05-24 NOTE — Telephone Encounter (Signed)
Pt states she missed a dose of
